# Patient Record
Sex: Female | Born: 1980 | Race: Black or African American | Hispanic: No | Marital: Single | Smoking: Never smoker
Health system: Southern US, Community
[De-identification: ages and names within clinical notes are randomized; demographics above are authoritative.]

---

## 2002-01-29 ENCOUNTER — Emergency Department (HOSPITAL_COMMUNITY): Admission: EM | Admit: 2002-01-29 | Discharge: 2002-01-29 | Payer: Self-pay | Admitting: Emergency Medicine

## 2003-03-27 ENCOUNTER — Observation Stay (HOSPITAL_COMMUNITY): Admission: AC | Admit: 2003-03-27 | Discharge: 2003-03-28 | Payer: Self-pay

## 2003-03-27 ENCOUNTER — Encounter: Payer: Self-pay | Admitting: Emergency Medicine

## 2003-04-06 ENCOUNTER — Ambulatory Visit (HOSPITAL_COMMUNITY): Admission: RE | Admit: 2003-04-06 | Discharge: 2003-04-06 | Payer: Self-pay | Admitting: Orthopedic Surgery

## 2003-04-06 ENCOUNTER — Encounter: Payer: Self-pay | Admitting: Orthopedic Surgery

## 2004-10-10 ENCOUNTER — Ambulatory Visit: Payer: Self-pay | Admitting: Sports Medicine

## 2004-11-21 ENCOUNTER — Ambulatory Visit: Payer: Self-pay | Admitting: Sports Medicine

## 2005-06-14 ENCOUNTER — Ambulatory Visit: Payer: Self-pay | Admitting: Family Medicine

## 2005-10-30 ENCOUNTER — Ambulatory Visit: Payer: Self-pay | Admitting: Family Medicine

## 2006-10-17 ENCOUNTER — Ambulatory Visit: Payer: Self-pay | Admitting: Family Medicine

## 2006-10-17 DIAGNOSIS — B9789 Other viral agents as the cause of diseases classified elsewhere: Secondary | ICD-10-CM

## 2007-01-22 ENCOUNTER — Encounter: Payer: Self-pay | Admitting: *Deleted

## 2007-02-20 ENCOUNTER — Telehealth: Payer: Self-pay | Admitting: *Deleted

## 2008-08-05 ENCOUNTER — Telehealth: Payer: Self-pay | Admitting: *Deleted

## 2008-09-03 ENCOUNTER — Telehealth (INDEPENDENT_AMBULATORY_CARE_PROVIDER_SITE_OTHER): Payer: Self-pay | Admitting: *Deleted

## 2008-09-09 ENCOUNTER — Ambulatory Visit: Payer: Self-pay | Admitting: Family Medicine

## 2008-09-09 ENCOUNTER — Telehealth: Payer: Self-pay | Admitting: Family Medicine

## 2008-09-09 DIAGNOSIS — J069 Acute upper respiratory infection, unspecified: Secondary | ICD-10-CM | POA: Insufficient documentation

## 2009-07-01 ENCOUNTER — Ambulatory Visit (HOSPITAL_COMMUNITY): Admission: RE | Admit: 2009-07-01 | Discharge: 2009-07-01 | Payer: Self-pay | Admitting: Obstetrics

## 2009-09-23 ENCOUNTER — Ambulatory Visit (HOSPITAL_COMMUNITY): Admission: RE | Admit: 2009-09-23 | Discharge: 2009-09-23 | Payer: Self-pay | Admitting: Obstetrics

## 2009-11-23 ENCOUNTER — Inpatient Hospital Stay (HOSPITAL_COMMUNITY): Admission: RE | Admit: 2009-11-23 | Discharge: 2009-11-27 | Payer: Self-pay | Admitting: Obstetrics

## 2010-08-19 ENCOUNTER — Ambulatory Visit (HOSPITAL_COMMUNITY)
Admission: RE | Admit: 2010-08-19 | Discharge: 2010-08-19 | Payer: Self-pay | Source: Home / Self Care | Attending: Obstetrics | Admitting: Obstetrics

## 2010-09-09 ENCOUNTER — Ambulatory Visit (HOSPITAL_COMMUNITY)
Admission: RE | Admit: 2010-09-09 | Discharge: 2010-09-09 | Payer: Self-pay | Source: Home / Self Care | Attending: Obstetrics | Admitting: Obstetrics

## 2010-09-09 ENCOUNTER — Other Ambulatory Visit (HOSPITAL_COMMUNITY): Payer: Self-pay | Admitting: Obstetrics

## 2010-09-09 DIAGNOSIS — R6252 Short stature (child): Secondary | ICD-10-CM

## 2010-10-07 ENCOUNTER — Ambulatory Visit (HOSPITAL_COMMUNITY)
Admission: RE | Admit: 2010-10-07 | Discharge: 2010-10-07 | Disposition: A | Discharge status: Home / Self Care | Payer: Private Health Insurance - Indemnity | Source: Ambulatory Visit | Attending: Obstetrics | Admitting: Obstetrics

## 2010-10-07 ENCOUNTER — Other Ambulatory Visit (HOSPITAL_COMMUNITY): Payer: Self-pay | Admitting: Obstetrics

## 2010-10-07 DIAGNOSIS — O26879 Cervical shortening, unspecified trimester: Secondary | ICD-10-CM | POA: Insufficient documentation

## 2010-10-07 DIAGNOSIS — O34219 Maternal care for unspecified type scar from previous cesarean delivery: Secondary | ICD-10-CM

## 2010-10-07 DIAGNOSIS — R6252 Short stature (child): Secondary | ICD-10-CM

## 2010-10-07 DIAGNOSIS — Z3689 Encounter for other specified antenatal screening: Secondary | ICD-10-CM | POA: Insufficient documentation

## 2010-10-21 ENCOUNTER — Ambulatory Visit (HOSPITAL_COMMUNITY): Payer: Private Health Insurance - Indemnity

## 2010-11-01 ENCOUNTER — Other Ambulatory Visit (HOSPITAL_COMMUNITY): Payer: Self-pay | Admitting: Obstetrics

## 2010-11-01 ENCOUNTER — Ambulatory Visit (HOSPITAL_COMMUNITY)
Admission: RE | Admit: 2010-11-01 | Discharge: 2010-11-01 | Disposition: A | Discharge status: Home / Self Care | Payer: Private Health Insurance - Indemnity | Source: Ambulatory Visit | Attending: Obstetrics | Admitting: Obstetrics

## 2010-11-01 DIAGNOSIS — O26879 Cervical shortening, unspecified trimester: Secondary | ICD-10-CM

## 2010-11-01 DIAGNOSIS — O34219 Maternal care for unspecified type scar from previous cesarean delivery: Secondary | ICD-10-CM | POA: Insufficient documentation

## 2010-11-02 LAB — CBC
HCT: 34.8 % — ABNORMAL LOW (ref 36.0–46.0)
Hemoglobin: 11.5 g/dL — ABNORMAL LOW (ref 12.0–15.0)
MCV: 86.8 fL (ref 78.0–100.0)
RBC: 3.93 MIL/uL (ref 3.87–5.11)
RBC: 4.38 MIL/uL (ref 3.87–5.11)
WBC: 10.8 10*3/uL — ABNORMAL HIGH (ref 4.0–10.5)
WBC: 7.1 10*3/uL (ref 4.0–10.5)

## 2010-11-02 LAB — RPR: RPR Ser Ql: NONREACTIVE

## 2010-11-04 ENCOUNTER — Other Ambulatory Visit (HOSPITAL_COMMUNITY): Payer: Private Health Insurance - Indemnity

## 2010-11-15 ENCOUNTER — Ambulatory Visit (HOSPITAL_COMMUNITY)
Admission: RE | Admit: 2010-11-15 | Discharge: 2010-11-15 | Disposition: A | Discharge status: Home / Self Care | Payer: Managed Care, Other (non HMO) | Source: Ambulatory Visit | Attending: Obstetrics | Admitting: Obstetrics

## 2010-11-15 DIAGNOSIS — O26879 Cervical shortening, unspecified trimester: Secondary | ICD-10-CM

## 2010-11-15 DIAGNOSIS — O34219 Maternal care for unspecified type scar from previous cesarean delivery: Secondary | ICD-10-CM

## 2011-01-10 ENCOUNTER — Other Ambulatory Visit (HOSPITAL_COMMUNITY): Payer: Self-pay | Admitting: Obstetrics

## 2011-01-10 DIAGNOSIS — O34219 Maternal care for unspecified type scar from previous cesarean delivery: Secondary | ICD-10-CM

## 2011-01-11 ENCOUNTER — Ambulatory Visit (HOSPITAL_COMMUNITY)
Admission: RE | Admit: 2011-01-11 | Discharge: 2011-01-11 | Disposition: A | Discharge status: Home / Self Care | Payer: Managed Care, Other (non HMO) | Source: Ambulatory Visit | Attending: Obstetrics | Admitting: Obstetrics

## 2011-01-11 ENCOUNTER — Other Ambulatory Visit (HOSPITAL_COMMUNITY): Payer: Self-pay | Admitting: Obstetrics

## 2011-01-11 DIAGNOSIS — Z3689 Encounter for other specified antenatal screening: Secondary | ICD-10-CM

## 2011-01-11 DIAGNOSIS — O3660X0 Maternal care for excessive fetal growth, unspecified trimester, not applicable or unspecified: Secondary | ICD-10-CM | POA: Insufficient documentation

## 2011-01-11 DIAGNOSIS — O34219 Maternal care for unspecified type scar from previous cesarean delivery: Secondary | ICD-10-CM

## 2011-01-13 ENCOUNTER — Inpatient Hospital Stay (HOSPITAL_COMMUNITY)
Admission: RE | Admit: 2011-01-13 | Discharge: 2011-01-16 | DRG: 766 | Disposition: A | Discharge status: Home / Self Care | Payer: Managed Care, Other (non HMO) | Source: Ambulatory Visit | Attending: Obstetrics | Admitting: Obstetrics

## 2011-01-13 ENCOUNTER — Other Ambulatory Visit: Payer: Self-pay | Admitting: Obstetrics

## 2011-01-13 DIAGNOSIS — Z302 Encounter for sterilization: Secondary | ICD-10-CM

## 2011-01-13 DIAGNOSIS — O34219 Maternal care for unspecified type scar from previous cesarean delivery: Principal | ICD-10-CM | POA: Diagnosis present

## 2011-01-13 LAB — CBC
HCT: 38 % (ref 36.0–46.0)
Hemoglobin: 12.4 g/dL (ref 12.0–15.0)
MCH: 26.7 pg (ref 26.0–34.0)
MCHC: 32.6 g/dL (ref 30.0–36.0)
MCV: 81.9 fL (ref 78.0–100.0)

## 2011-01-14 LAB — CBC
Hemoglobin: 10.3 g/dL — ABNORMAL LOW (ref 12.0–15.0)
MCH: 26.1 pg (ref 26.0–34.0)
MCV: 81.2 fL (ref 78.0–100.0)
RBC: 3.94 MIL/uL (ref 3.87–5.11)
WBC: 7.3 10*3/uL (ref 4.0–10.5)

## 2011-01-18 NOTE — Op Note (Signed)
  NAMESHAWAN, TOSH NO.:  [id number]  MEDICAL RECORD NO.:  [id number]           PATIENT TYPE:  I  LOCATION:  9108                          FACILITY:  [facility]  PHYSICIAN:  Kathreen Cosier, M.D.DATE OF BIRTH:  1981/04/30  DATE OF PROCEDURE:  01/13/2011 DATE OF DISCHARGE:                              OPERATIVE REPORT   PREOPERATIVE DIAGNOSIS:  Previous cesarean section at term, desires repeat and tubal ligation.  SURGEON:  Kathreen Cosier, MD  FIRST ASSISTANT:  Charles A. Clearance Coots, MD  ANESTHESIA:  Spinal.  PROCEDURE:  The patient was placed on the operating table in supine position.  After the spinal administered, abdomen was prepped and draped.  Bladder emptied with Foley catheter.  Transverse suprapubic incision was made, carried down to rectus fascia.  Fascia was cleaned and incised at length of the incision.  Recti muscles retracted laterally.  Peritoneum incised longitudinally.  Transverse incision was made in the visceral peritoneum above the bladder.  Bladder mobilized inferiorly.  Transverse lower uterine incision made.  The fluid was meconium stained.  The patient delivered of a female 8 pounds 6 ounces with a loose shoulder cord.  The team was in attendance.  Apgars were 8 and 9.  Placenta posterior, removed manually and sent to pathology. Uterine cavity cleaned with dry laps.  The uterine was incision closed in one layer with continuous suture of #1 chromic.  Bladder flap reattached with 2-0 chromic.  Right tube grasped in midportion with Babcock clamp.  Zero plain suture placed in the mesosalpinx below the portion of tube within clamp.  This was tied with zero plain suture and approximately 1 inch of tube transected.  The procedure done in a similar fashion the other side.  Lap and sponge counts correct.  Abdomen closed in layers.  Peritoneum, continuous suture of 0 chromic.  Fascia, continuous suture of 0 Dexon and the skin closed with  subcuticular stitch of 4-0 Monocryl.  Blood loss was 600 mL.  The patient tolerated the procedure well, taken to recovery room in good condition.          ______________________________ Kathreen Cosier, M.D.     BAM/MEDQ  D:  01/13/2011  T:  01/14/2011  Job:  [id number]  Electronically Signed by Francoise Ceo M.D. on 01/18/2011 07:47:06 AM

## 2011-01-18 NOTE — Discharge Summary (Signed)
  NAMETYISHIA, AUNE NO.:  [id number]  MEDICAL RECORD NO.:  [id number]           PATIENT TYPE:  I  LOCATION:  9108                          FACILITY:  [facility]  PHYSICIAN:  Kathreen Cosier, M.D.DATE OF BIRTH:  1981/05/27  DATE OF ADMISSION:  01/13/2011 DATE OF DISCHARGE:  01/16/2011                              DISCHARGE SUMMARY   The patient is a 30 year old gravida 4, para 1-0-2-1, [facility] January 14, 2011. She had a previous C-section for macrosomia and was now at term and wanted repeat C-section and tubal ligation.  Her GBS was negative and she had no problems with pregnancy.  She underwent a repeat low- transverse cesarean section and tubal ligation.  She had a female Apgar 8 and 9, weighing 8 pounds 6 ounces.  She had she had meconium-stained fluid.  Placenta was sent to Pathology.  Postoperatively, she did well. Her hemoglobin was 10.3.  She was discharged on the third postoperative day, ambulatory on a regular diet to see me in 6 weeks.  DISCHARGE DIAGNOSIS:  Status post repeat low-transverse cesarean section at term and tubal ligation for multiparity.          ______________________________ Kathreen Cosier, M.D.     BAM/MEDQ  D:  01/16/2011  T:  01/16/2011  Job:  [id number]  Electronically Signed by Francoise Ceo M.D. on 01/18/2011 07:47:08 AM

## 2012-03-17 IMAGING — US US OB FOLLOW-UP
1 series · 13 of 13 positions shown · non-contrast
Comparison: none

[Series 1: us ob follow up · 13 acquisitions, 13 frames shown]
[im 1/13]
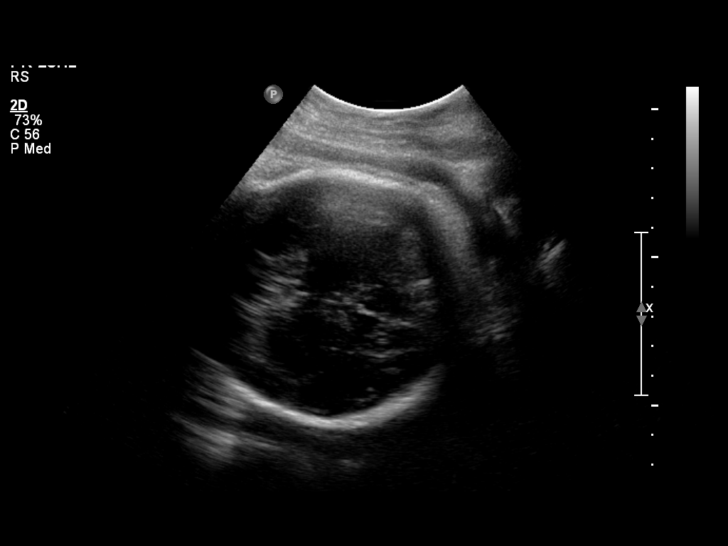
[im 2/13]
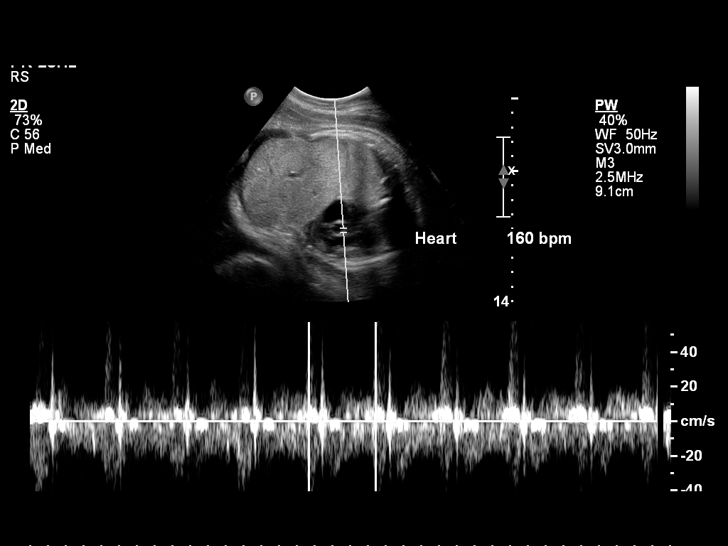
[im 3/13]
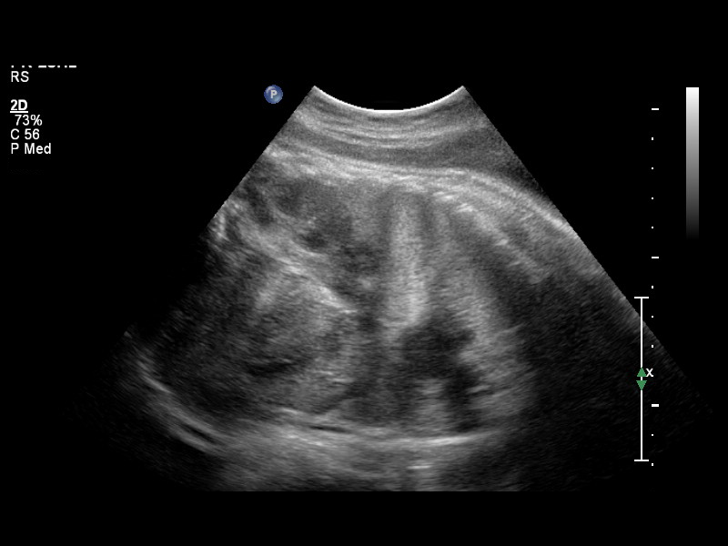
[im 4/13]
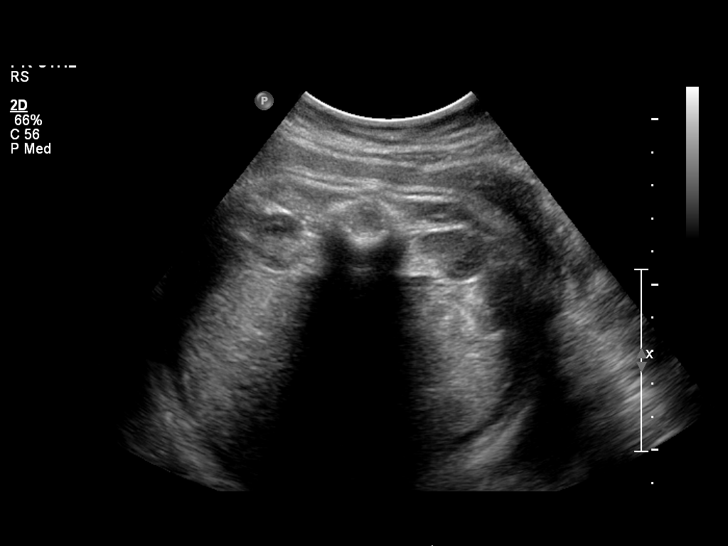
[im 5/13]
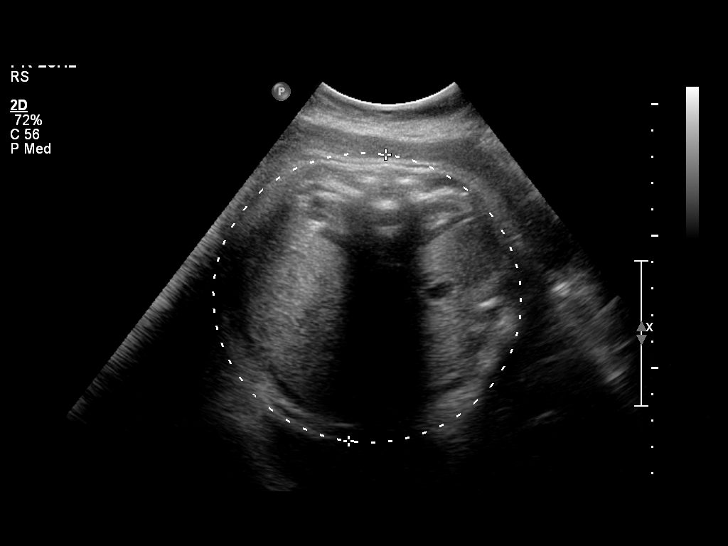
[im 6/13]
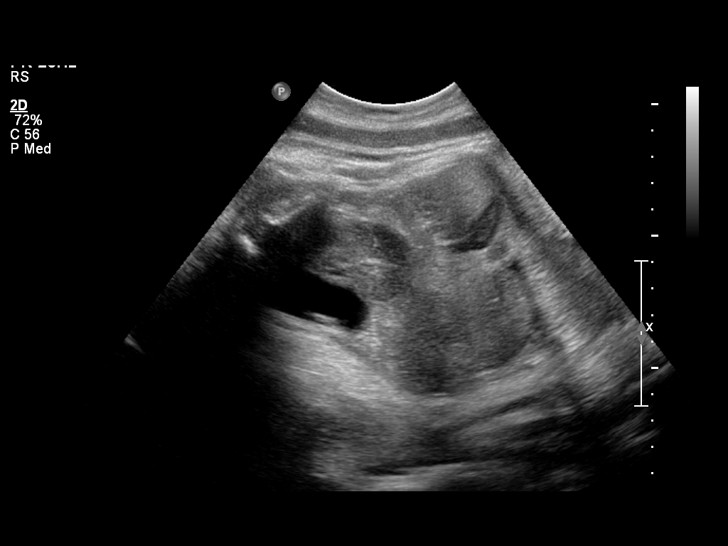
[im 7/13]
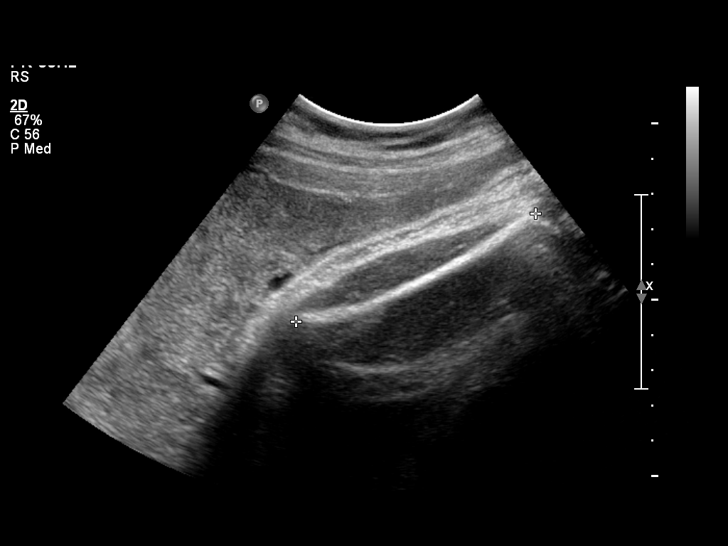
[im 8/13]
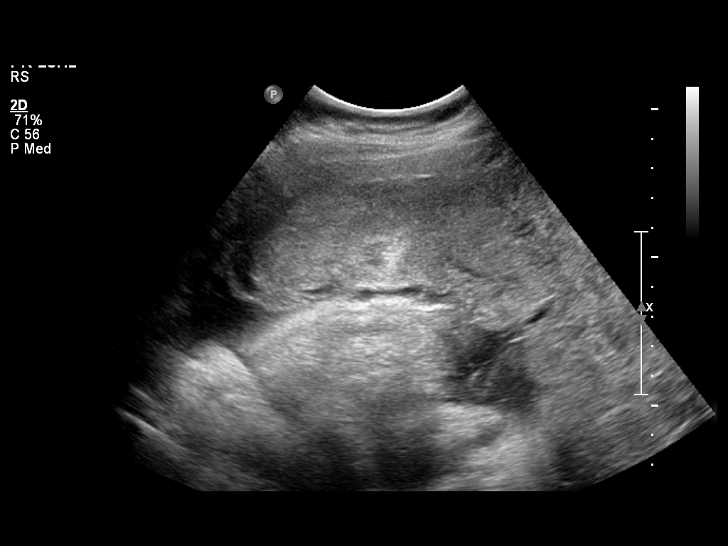
[im 9/13]
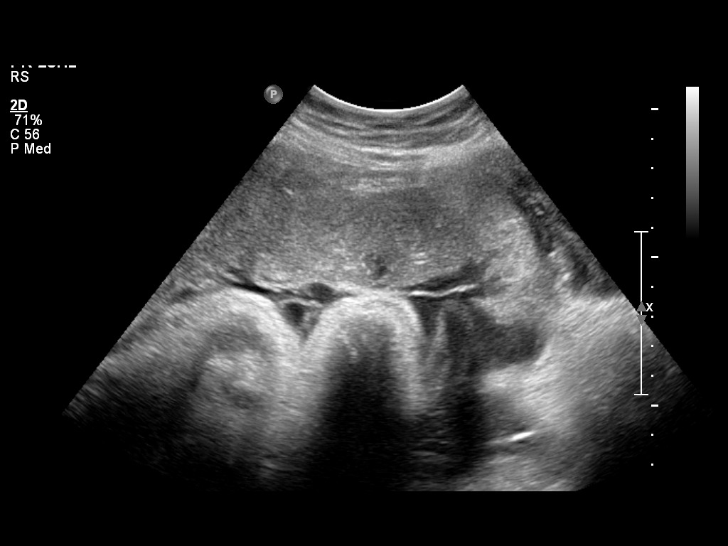
[im 10/13]
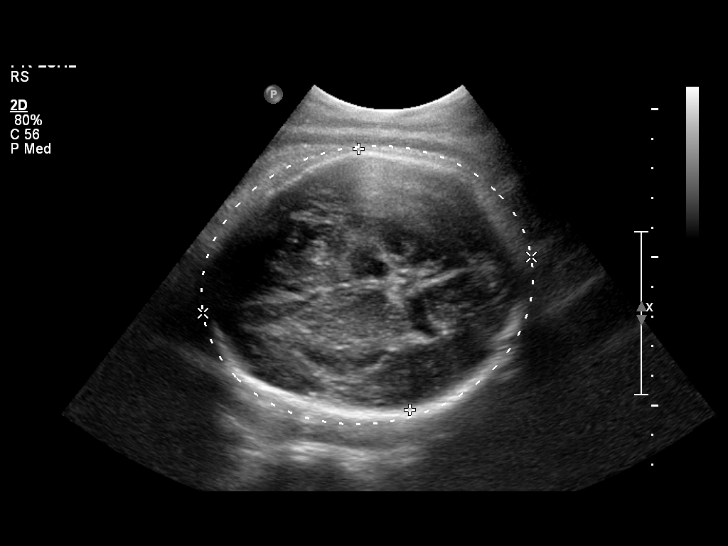
[im 11/13]
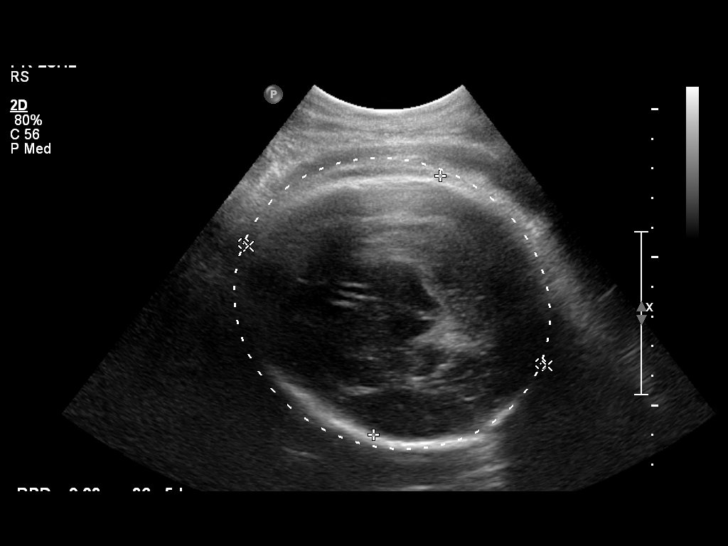
[im 12/13]
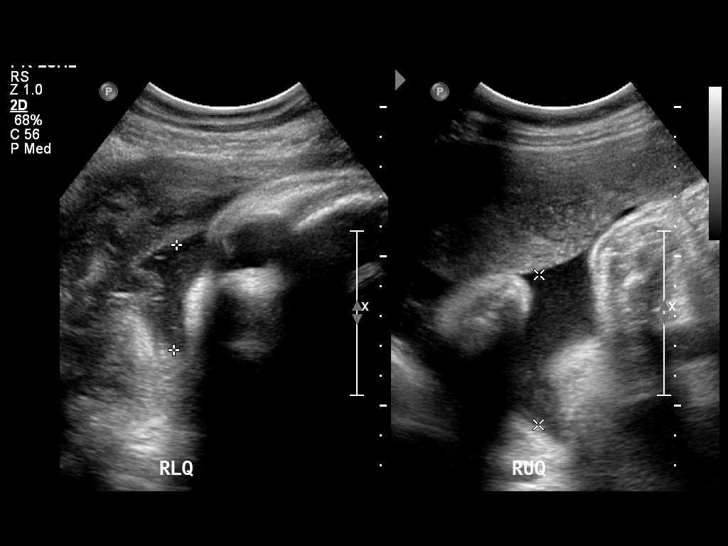
[im 13/13]
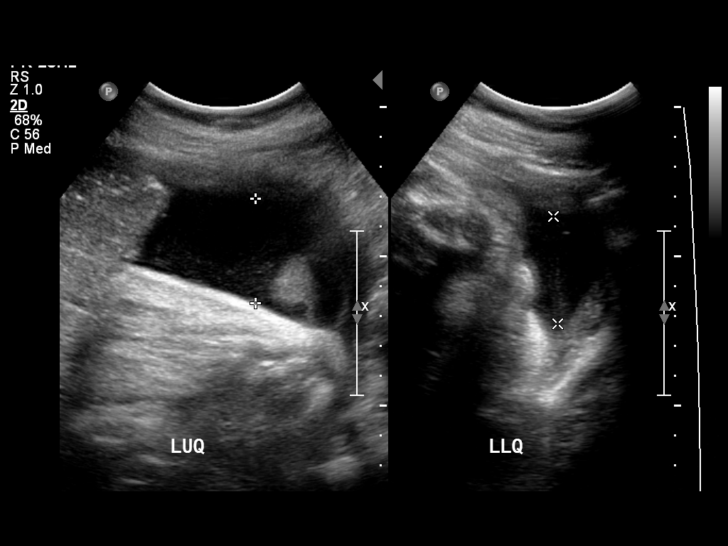

[13 of 13 positions shown; findings below may reference images not displayed]

OBSTETRICS REPORT
                      (Signed Final 01/11/2011 [DATE])

 Order#:         [id number]
Procedures

 US OB FOLLOW UP                                       76816.1
Indications

 Assess Fetal Growth / Estimated Fetal Weight
 Size greater than dates (Large for gestational [AGE]
Fetal Evaluation

 Fetal Heart Rate:  160                          bpm
 Cardiac Activity:  Observed
 Presentation:      Cephalic
 Placenta:          Anterior, above cervical os

 Amniotic Fluid
 AFI FV:      Subjectively within normal limits
 AFI Sum:     15.64   cm       65  %Tile     Larg Pckt:    5.02  cm
 RUQ:   5.02    cm   RLQ:    3.5    cm    LUQ:   3.6     cm   LLQ:    3.52   cm
Biometry

 BPD:     90.1  mm     G. Age:  36w 4d                CI:         82.9   70 - 86
 OFD:    108.7  mm                                    FL/HC:      23.0   20.7 -

 HC:     323.9  mm     G. Age:  36w 5d      < 3  %    HC/AC:      0.91   0.87 -

 AC:     356.2  mm     G. Age:  39w 4d       73  %    FL/BPD:     82.7   71 - 87
 FL:      74.5  mm     G. Age:  38w 1d       24  %    FL/AC:      20.9   20 - 24

 Est. FW:    7615  gm    7 lb 12 oz      72  %
Gestational Age

 LMP:           39w 4d        Date:  04/09/10                 EDD:   01/14/11
 U/S Today:     37w 5d                                        EDD:   01/27/11
 Best:          39w 4d     Det. By:  LMP  (04/09/10)          EDD:   01/14/11
Anatomy

 Cranium:           Appears normal      Aortic Arch:       Previously seen
 Fetal Cavum:       Previously seen     Ductal Arch:       Previously seen
 Ventricles:        Previously seen     Diaphragm:         Previously seen
 Choroid Plexus:    Previously seen     Stomach:           Appears
                                                           normal, left
                                                           sided
 Cerebellum:        Previously seen     Abdomen:           Previously seen
 Posterior Fossa:   Previously seen     Abdominal Wall:    Previously seen
 Nuchal Fold:       Previously seen     Cord Vessels:      Previously seen
 Face:              Previously seen     Kidneys:           Appear normal
 Heart:             Previously seen     Bladder:           Appears normal
 RVOT:              Previously seen     Spine:             Previously seen
 LVOT:              Previously seen     Limbs:             Previously seen
Cervix Uterus Adnexa

 Cervix:       Not visualized (advanced GA >34 wks)
Impression

 Assigned GA is currently 39w 4d.   Appropriate fetal growth,
 with EFW at 72 %ile.
 Amniotic fluid within normal limits, with AFI of 15.64 cm.

## 2013-06-19 ENCOUNTER — Other Ambulatory Visit: Payer: Self-pay

## 2015-08-11 ENCOUNTER — Emergency Department (HOSPITAL_BASED_OUTPATIENT_CLINIC_OR_DEPARTMENT_OTHER): Payer: BLUE CROSS/BLUE SHIELD

## 2015-08-11 ENCOUNTER — Encounter (HOSPITAL_BASED_OUTPATIENT_CLINIC_OR_DEPARTMENT_OTHER): Payer: Self-pay | Admitting: Emergency Medicine

## 2015-08-11 ENCOUNTER — Emergency Department (HOSPITAL_BASED_OUTPATIENT_CLINIC_OR_DEPARTMENT_OTHER)
Admission: EM | Admit: 2015-08-11 | Discharge: 2015-08-12 | Disposition: A | Discharge status: Home / Self Care | Payer: BLUE CROSS/BLUE SHIELD | Attending: Emergency Medicine | Admitting: Emergency Medicine

## 2015-08-11 DIAGNOSIS — J069 Acute upper respiratory infection, unspecified: Secondary | ICD-10-CM | POA: Diagnosis not present

## 2015-08-11 DIAGNOSIS — R1110 Vomiting, unspecified: Secondary | ICD-10-CM | POA: Insufficient documentation

## 2015-08-11 DIAGNOSIS — Z3202 Encounter for pregnancy test, result negative: Secondary | ICD-10-CM | POA: Diagnosis not present

## 2015-08-11 DIAGNOSIS — R05 Cough: Secondary | ICD-10-CM | POA: Diagnosis present

## 2015-08-11 LAB — RAPID STREP SCREEN (MED CTR MEBANE ONLY): STREPTOCOCCUS, GROUP A SCREEN (DIRECT): NEGATIVE

## 2015-08-11 LAB — PREGNANCY, URINE: PREG TEST UR: NEGATIVE

## 2015-08-11 NOTE — ED Notes (Signed)
Patient transported to X-ray 

## 2015-08-11 NOTE — ED Notes (Addendum)
Patient reports that she went to her PMD and was seen and treated with Rx medications. The patient reports that the medications that she was prescribed were not called into the pharmacy. So she came here. Patient reports that she has had an on and off sore throat with cough. Patient is intermittently smiling when she is answering the questions.

## 2015-08-11 NOTE — ED Provider Notes (Signed)
CSN: [id number]     Arrival date & time 08/11/15  2058 History   First MD Initiated Contact with Patient 08/11/15 2256     Chief Complaint  Patient presents with  . Cough     (Consider location/radiation/quality/duration/timing/severity/associated sxs/prior Treatment) HPI Comments: Cough, severe 4 weeks of cough however worse, more dry, painful over last 3-4 days Congestion, clear Mild SOB with cough Sore throat, today from coughing Vomiting x1 yesterday Today nausea No urinary symptoms, abd pain Theraflu, mucinex not helping HA started yesterday, started slowly got worse, with cough Medicine helps temporarily Saw regular doctor, was supposed to give abx Wheezing  No med problems    Patient is a 34 y.o. female presenting with cough.  Cough Associated symptoms: chest pain (with cough), fever, headaches, rhinorrhea, sore throat and wheezing   Associated symptoms: no rash and no shortness of breath     History reviewed. No pertinent past medical history. Past Surgical History  Procedure Laterality Date  . Cesarean section     No family history on file. Social History  Substance Use Topics  . Smoking status: Never Smoker   . Smokeless tobacco: None  . Alcohol Use: Yes     Comment: drinks almost every day but it varies between all of the types that she drinks.    OB History    No data available     Review of Systems  Constitutional: Positive for fever and fatigue.  HENT: Positive for congestion, rhinorrhea and sore throat.   Eyes: Negative for visual disturbance.  Respiratory: Positive for cough and wheezing. Negative for shortness of breath.   Cardiovascular: Positive for chest pain (with cough).  Gastrointestinal: Positive for vomiting (yesterday). Negative for nausea and abdominal pain.  Genitourinary: Negative for dysuria and difficulty urinating.  Musculoskeletal: Negative for back pain and neck pain.  Skin: Negative for rash.  Neurological: Positive for  headaches. Negative for syncope.      Allergies  Review of patient's allergies indicates no known allergies.  Home Medications   Prior to Admission medications   Medication Sig Start Date End Date Taking? Authorizing Provider  benzonatate (TESSALON) 100 MG capsule Take 1 capsule (100 mg total) by mouth every 8 (eight) hours. 08/12/15   Alvira Monday, MD   BP 117/79 mmHg  Pulse 92  Temp(Src) 100.4 F (38 C) (Oral)  Resp 18  Ht  (1.626 m)  Wt 183 lb (83.008 kg)  BMI 31.40 kg/m2  SpO2 99%  LMP 08/04/2015 Physical Exam  Constitutional: She is oriented to person, place, and time. She appears well-developed and well-nourished. No distress.  HENT:  Head: Normocephalic and atraumatic.  Mouth/Throat: Oropharynx is clear and moist. No oropharyngeal exudate.  Eyes: Conjunctivae and EOM are normal.  Neck: Normal range of motion.  Cardiovascular: Normal rate, regular rhythm, normal heart sounds and intact distal pulses.  Exam reveals no gallop and no friction rub.   No murmur heard. Pulmonary/Chest: Effort normal and breath sounds normal. No respiratory distress. She has no wheezes. She has no rales.  Abdominal: Soft. She exhibits no distension. There is no tenderness. There is no guarding.  Musculoskeletal: She exhibits no edema or tenderness.  Neurological: She is alert and oriented to person, place, and time.  Skin: Skin is warm and dry. No rash noted. She is not diaphoretic. No erythema.  Nursing note and vitals reviewed.   ED Course  Procedures (including critical care time) Labs Review Labs Reviewed  RAPID STREP SCREEN (NOT AT [facility])  CULTURE, GROUP A STREP  PREGNANCY, URINE    Imaging Review Dg Chest 2 View  08/11/2015  CLINICAL DATA:  Acute onset of generalized chest pain, cough, shortness of breath, fever and sore throat. Initial encounter. EXAM: CHEST  2 VIEW COMPARISON:  None. FINDINGS: The lungs are well-aerated. Mild peribronchial thickening is noted. There  is no evidence of focal opacification, pleural effusion or pneumothorax. The heart is normal in size; the mediastinal contour is within normal limits. No acute osseous abnormalities are seen. IMPRESSION: Mild peribronchial thickening noted.  Lungs otherwise clear. Electronically Signed   By: Roanna RaiderJeffery  Chang M.D.   On: 08/11/2015 23:51   I have personally reviewed and evaluated these images and lab results as part of my medical decision-making.   EKG Interpretation None      MDM   Final diagnoses:  URI (upper respiratory infection)   34yo female with no significant medical history presents with concern for cough, sore throat, nasal congestion.  CXR shows no sign of pneumonia.  Strep screen negative. No sign PTA/RPA/epiglottitis/otitis media. No facial tenderness and only days of nasa lcongestion and doubt bacterial sinus infection.  Patient with temperature of 100.4. No urinary symptoms and benign abdominal exam and doubt other intraabdominal cause of fevers. Pt likely with viral syndrome as etiology of symptoms.  Recommend supportive treatment, wrote rx for tessalon pearls for cough. Patient discharged in stable condition with understanding of reasons to return.   Alvira MondayErin Roczen Waymire, MD 08/12/15 [phone]

## 2015-08-12 MED ORDER — BENZONATATE 100 MG PO CAPS: 100.0000 mg | ORAL_CAPSULE | Freq: Three times a day (TID) | ORAL | Status: DC

## 2015-08-12 NOTE — Discharge Instructions (Signed)
Upper Respiratory Infection, Adult Most upper respiratory infections (URIs) are a viral infection of the air passages leading to the lungs. A URI affects the nose, throat, and upper air passages. The most common type of URI is nasopharyngitis and is typically referred to as "the common cold." URIs run their course and usually go away on their own. Most of the time, a URI does not require medical attention, but sometimes a bacterial infection in the upper airways can follow a viral infection. This is called a secondary infection. Sinus and middle ear infections are common types of secondary upper respiratory infections. Bacterial pneumonia can also complicate a URI. A URI can worsen asthma and chronic obstructive pulmonary disease (COPD). Sometimes, these complications can require emergency medical care and may be life threatening.  CAUSES Almost all URIs are caused by viruses. A virus is a type of germ and can spread from one person to another.  RISKS FACTORS You may be at risk for a URI if:   You smoke.   You have chronic heart or lung disease.  You have a weakened defense (immune) system.   You are very young or very old.   You have nasal allergies or asthma.  You work in crowded or poorly ventilated areas.  You work in health care facilities or schools. SIGNS AND SYMPTOMS  Symptoms typically develop 2-3 days after you come in contact with a cold virus. Most viral URIs last 7-10 days. However, viral URIs from the influenza virus (flu virus) can last 14-18 days and are typically more severe. Symptoms may include:   Runny or stuffy (congested) nose.   Sneezing.   Cough.   Sore throat.   Headache.   Fatigue.   Fever.   Loss of appetite.   Pain in your forehead, behind your eyes, and over your cheekbones (sinus pain).  Muscle aches.  DIAGNOSIS  Your health care provider may diagnose a URI by:  Physical exam.  Tests to check that your symptoms are not due to  another condition such as:  Strep throat.  Sinusitis.  Pneumonia.  Asthma. TREATMENT  A URI goes away on its own with time. It cannot be cured with medicines, but medicines may be prescribed or recommended to relieve symptoms. Medicines may help:  Reduce your fever.  Reduce your cough.  Relieve nasal congestion. HOME CARE INSTRUCTIONS   Take medicines only as directed by your health care provider.   Gargle warm saltwater or take cough drops to comfort your throat as directed by your health care provider.  Use a warm mist humidifier or inhale steam from a shower to increase air moisture. This may make it easier to breathe.  Drink enough fluid to keep your urine clear or pale yellow.   Eat soups and other clear broths and maintain good nutrition.   Rest as needed.   Return to work when your temperature has returned to normal or as your health care provider advises. You may need to stay home longer to avoid infecting others. You can also use a face mask and careful hand washing to prevent spread of the virus.  Increase the usage of your inhaler if you have asthma.   Do not use any tobacco products, including cigarettes, chewing tobacco, or electronic cigarettes. If you need help quitting, ask your health care provider. PREVENTION  The best way to protect yourself from getting a cold is to practice good hygiene.   Avoid oral or hand contact with people with cold   symptoms.   Wash your hands often if contact occurs.  There is no clear evidence that vitamin C, vitamin E, echinacea, or exercise reduces the chance of developing a cold. However, it is always recommended to get plenty of rest, exercise, and practice good nutrition.  SEEK MEDICAL CARE IF:   You are getting worse rather than better.   Your symptoms are not controlled by medicine.   You have chills.  You have worsening shortness of breath.  You have brown or red mucus.  You have yellow or brown nasal  discharge.  You have pain in your face, especially when you bend forward.  You have a fever.  You have swollen neck glands.  You have pain while swallowing.  You have white areas in the back of your throat. SEEK IMMEDIATE MEDICAL CARE IF:   You have severe or persistent:  Headache.  Ear pain.  Sinus pain.  Chest pain.  You have chronic lung disease and any of the following:  Wheezing.  Prolonged cough.  Coughing up blood.  A change in your usual mucus.  You have a stiff neck.  You have changes in your:  Vision.  Hearing.  Thinking.  Mood. MAKE SURE YOU:   Understand these instructions.  Will watch your condition.  Will get help right away if you are not doing well or get worse.   This information is not intended to replace advice given to you by your health care provider. Make sure you discuss any questions you have with your health care provider.   Document Released: 01/24/2001 Document Revised: 12/15/2014 Document Reviewed: 11/05/2013 Elsevier Interactive Patient Education 2016 [employer]  

## 2015-08-14 LAB — CULTURE, GROUP A STREP: Strep A Culture: NEGATIVE

## 2016-05-02 LAB — LAB REPORT - SCANNED: PAP SMEAR: NEGATIVE

## 2018-04-29 ENCOUNTER — Emergency Department (HOSPITAL_COMMUNITY)
Admission: EM | Admit: 2018-04-29 | Discharge: 2018-04-29 | Disposition: A | Discharge status: Home / Self Care | Payer: Medicaid Other | Attending: Emergency Medicine | Admitting: Emergency Medicine

## 2018-04-29 ENCOUNTER — Encounter (HOSPITAL_COMMUNITY): Payer: Self-pay | Admitting: Emergency Medicine

## 2018-04-29 ENCOUNTER — Emergency Department (HOSPITAL_COMMUNITY): Payer: Medicaid Other

## 2018-04-29 DIAGNOSIS — Y929 Unspecified place or not applicable: Secondary | ICD-10-CM | POA: Diagnosis not present

## 2018-04-29 DIAGNOSIS — S0083XA Contusion of other part of head, initial encounter: Secondary | ICD-10-CM | POA: Diagnosis not present

## 2018-04-29 DIAGNOSIS — Y9389 Activity, other specified: Secondary | ICD-10-CM | POA: Diagnosis not present

## 2018-04-29 DIAGNOSIS — S41112A Laceration without foreign body of left upper arm, initial encounter: Secondary | ICD-10-CM | POA: Diagnosis not present

## 2018-04-29 DIAGNOSIS — S56522A Laceration of other extensor muscle, fascia and tendon at forearm level, left arm, initial encounter: Secondary | ICD-10-CM

## 2018-04-29 DIAGNOSIS — D649 Anemia, unspecified: Secondary | ICD-10-CM | POA: Diagnosis not present

## 2018-04-29 DIAGNOSIS — S0990XA Unspecified injury of head, initial encounter: Secondary | ICD-10-CM | POA: Diagnosis present

## 2018-04-29 DIAGNOSIS — S56922A Laceration of unspecified muscles, fascia and tendons at forearm level, left arm, initial encounter: Secondary | ICD-10-CM

## 2018-04-29 DIAGNOSIS — S51802A Unspecified open wound of left forearm, initial encounter: Secondary | ICD-10-CM

## 2018-04-29 DIAGNOSIS — Y999 Unspecified external cause status: Secondary | ICD-10-CM | POA: Diagnosis not present

## 2018-04-29 LAB — CBC WITH DIFFERENTIAL/PLATELET
Abs Immature Granulocytes: 0 10*3/uL (ref 0.0–0.1)
BASOS ABS: 0.1 10*3/uL (ref 0.0–0.1)
Basophils Relative: 1 %
EOS PCT: 5 %
Eosinophils Absolute: 0.2 10*3/uL (ref 0.0–0.7)
HCT: 30.2 % — ABNORMAL LOW (ref 36.0–46.0)
Hemoglobin: 8.8 g/dL — ABNORMAL LOW (ref 12.0–15.0)
Immature Granulocytes: 0 %
LYMPHS PCT: 25 %
Lymphs Abs: 1.1 10*3/uL (ref 0.7–4.0)
MCH: 20.8 pg — ABNORMAL LOW (ref 26.0–34.0)
MCHC: 29.1 g/dL — AB (ref 30.0–36.0)
MCV: 71.2 fL — ABNORMAL LOW (ref 78.0–100.0)
Monocytes Absolute: 0.4 10*3/uL (ref 0.1–1.0)
Monocytes Relative: 9 %
Neutro Abs: 2.7 10*3/uL (ref 1.7–7.7)
Neutrophils Relative %: 60 %
PLATELETS: 253 10*3/uL (ref 150–400)
RBC: 4.24 MIL/uL (ref 3.87–5.11)
RDW: 18.6 % — AB (ref 11.5–15.5)
WBC: 4.5 10*3/uL (ref 4.0–10.5)

## 2018-04-29 LAB — BASIC METABOLIC PANEL
Anion gap: 11 (ref 5–15)
BUN: 14 mg/dL (ref 6–20)
CO2: 23 mmol/L (ref 22–32)
Calcium: 8.9 mg/dL (ref 8.9–10.3)
Chloride: 104 mmol/L (ref 98–111)
Creatinine, Ser: 0.85 mg/dL (ref 0.44–1.00)
Glucose, Bld: 96 mg/dL (ref 70–99)
POTASSIUM: 3.3 mmol/L — AB (ref 3.5–5.1)
Sodium: 138 mmol/L (ref 135–145)

## 2018-04-29 LAB — I-STAT BETA HCG BLOOD, ED (MC, WL, AP ONLY): I-stat hCG, quantitative: <5 m[IU]/mL (ref <5)

## 2018-04-29 MED ORDER — TETANUS-DIPHTH-ACELL PERTUSSIS 5-2.5-18.5 LF-MCG/0.5 IM SUSP
0.5000 mL | Freq: Once | INTRAMUSCULAR | Status: DC
  Filled 2018-04-29: qty 0.5

## 2018-04-29 MED ORDER — LIDOCAINE-EPINEPHRINE (PF) 2 %-1:200000 IJ SOLN
10.0000 mL | Freq: Once | INTRAMUSCULAR | Status: AC
  Administered 2018-04-29: 10 mL
  Filled 2018-04-29: qty 20

## 2018-04-29 MED ORDER — TETANUS-DIPHTH-ACELL PERTUSSIS 5-2.5-18.5 LF-MCG/0.5 IM SUSP
0.5000 mL | Freq: Once | INTRAMUSCULAR | Status: AC
  Administered 2018-04-29: 0.5 mL via INTRAMUSCULAR

## 2018-04-29 MED ORDER — ACETAMINOPHEN 500 MG PO TABS
1000.0000 mg | ORAL_TABLET | Freq: Once | ORAL | Status: AC
  Administered 2018-04-29: 1000 mg via ORAL
  Filled 2018-04-29: qty 2

## 2018-04-29 MED ORDER — OXYCODONE-ACETAMINOPHEN 5-325 MG PO TABS
2.0000 | ORAL_TABLET | Freq: Once | ORAL | Status: AC
  Administered 2018-04-29: 2 via ORAL
  Filled 2018-04-29: qty 2

## 2018-04-29 MED ORDER — BACITRACIN ZINC 500 UNIT/GM EX OINT
TOPICAL_OINTMENT | Freq: Once | CUTANEOUS | Status: AC
  Administered 2018-04-29: 18:00:00 via TOPICAL
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
## 2018-04-29 NOTE — Discharge Instructions (Addendum)
Please have wound rechecked if you noticed pain or swelling Please use cold therapy to face and forearm Please use Tylenol and ibuprofen for pain Please return here if you are worse at any time. Your hemoglobin is low at 8.8.  Start iron and follow-up with your primary care physician for recheck in the next week
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
## 2018-05-12 ENCOUNTER — Encounter (HOSPITAL_COMMUNITY): Payer: Self-pay

## 2018-05-12 ENCOUNTER — Emergency Department (HOSPITAL_COMMUNITY)
Admission: EM | Admit: 2018-05-12 | Discharge: 2018-05-12 | Disposition: A | Discharge status: Home / Self Care | Payer: Medicaid Other | Attending: Emergency Medicine | Admitting: Emergency Medicine

## 2018-05-12 DIAGNOSIS — X58XXXD Exposure to other specified factors, subsequent encounter: Secondary | ICD-10-CM | POA: Insufficient documentation

## 2018-05-12 DIAGNOSIS — S51812D Laceration without foreign body of left forearm, subsequent encounter: Secondary | ICD-10-CM | POA: Insufficient documentation

## 2018-05-12 DIAGNOSIS — Z4802 Encounter for removal of sutures: Secondary | ICD-10-CM | POA: Insufficient documentation

## 2018-05-12 NOTE — ED Provider Notes (Signed)
[facility] EMERGENCY DEPARTMENT Provider Note   CSN: [id number] Arrival date & time: 05/12/18  1246     History   Chief Complaint Chief Complaint  Patient presents with  . Suture / Staple Removal    HPI Kayla [name] is a 37 y.o. female who presents for suture removal to left forearm laceration.  Patient has had good healing without any drainage or redness.  She has had minimal pain.  She has been washing it at home and using antibiotic ointment.  She denies any fevers.  HPI  History reviewed. No pertinent past medical history.  Patient Active Problem List   Diagnosis Date Noted  . URI 09/09/2008  . VIRAL INFECTION 10/17/2006    Past Surgical History:  Procedure Laterality Date  . CESAREAN SECTION       OB History   None      Home Medications    Prior to Admission medications   Medication Sig Start Date End Date Taking? Authorizing Provider  Aspirin-Salicylamide-Caffeine (BC HEADACHE POWDER PO) Take 1 packet by mouth as needed (headache).    [provider]  benzonatate (TESSALON) 100 MG capsule Take 1 capsule (100 mg total) by mouth every 8 (eight) hours. Patient not taking: Reported on 04/29/2018 08/12/15   Alvira Monday, MD    Family History No family history on file.  Social History Social History   Tobacco Use  . Smoking status: Never Smoker  . Smokeless tobacco: Never Used  Substance Use Topics  . Alcohol use: Yes    Comment: drinks almost every day but it varies between all of the types that she drinks.   . Drug use: No     Allergies   Patient has no known allergies.   Review of Systems Review of Systems  Constitutional: Negative for fever.  Skin: Positive for wound.     Physical Exam Updated Vital Signs BP 126/88 (BP Location: Right Arm)   Pulse 89   Temp 99.3 F (37.4 C) (Oral)   Resp 16   LMP 04/14/2018   SpO2 100%   Physical Exam  Constitutional: She is oriented to person, place,  and time. She appears well-developed and well-nourished. No distress.  HENT:  Head: Normocephalic and atraumatic.  Eyes: Conjunctivae are normal. Right eye exhibits no discharge. Left eye exhibits no discharge. No scleral icterus.  Pulmonary/Chest: Effort normal. No respiratory distress.  Neurological: She is alert and oriented to person, place, and time. Coordination normal.  Skin: Skin is warm and dry. No rash noted. She is not diaphoretic. No pallor.  Well-healing laceration to the left forearm with sutures in place, no tenderness, no drainage or erythema  Psychiatric: She has a normal mood and affect. Her behavior is normal. Judgment and thought content normal.  Nursing note and vitals reviewed.    ED Treatments / Results  Labs (all labs ordered are listed, but only abnormal results are displayed) Labs Reviewed - No data to display  EKG None  Radiology No results found.  Procedures .Suture Removal Date/Time: 05/12/2018 1:46 PM Performed by: Emi Holes, PA-C Authorized by: Emi Holes, PA-C   Consent:    Consent obtained:  Verbal   Consent given by:  Patient   Risks discussed:  Bleeding and pain   Alternatives discussed:  No treatment Location:    Location:  Upper extremity   Upper extremity location:  Arm   Arm location:  L lower arm Procedure details:    Wound  appearance:  No signs of infection and clean   Number of sutures removed:  7 Post-procedure details:    Post-removal:  Antibiotic ointment applied and dressing applied   Patient tolerance of procedure:  Tolerated well, no immediate complications   (including critical care time)  Medications Ordered in ED Medications - No data to display   Initial Impression / Assessment and Plan / ED Course  I have reviewed the triage vital signs and the nursing notes.  Pertinent labs & imaging results that were available during my care of the patient were reviewed by me and considered in my medical decision  making (see chart for details).       Pt to ER for suture removal and wound check as above. Procedure tolerated well. Vitals normal, no signs of infection. Scar minimization & return precautions given at dc.  Patient understands and agrees with plan.  Patient vitals stable and discharged in satisfactory condition.   Final Clinical Impressions(s) / ED Diagnoses   Final diagnoses:  Visit for suture removal    ED Discharge Orders    None       Emi Holes, PA-C 05/12/18 1346    Loren Racer, MD 05/12/18 [phone]

## 2018-05-12 NOTE — Discharge Instructions (Signed)
Make sure to cover and/or use sunscreen while in the sun.  Continue washing with warm soapy water.  Apply antibiotic ointment once daily.  Please return to emergency department if you develop any increasing pain, redness, swelling, drainage, red streaking away from the area.

## 2018-05-12 NOTE — ED Triage Notes (Signed)
Pt presents for evaluation of suture removal to L forearm. Stitches to a laceration on 9/16.

## 2019-02-28 ENCOUNTER — Encounter (HOSPITAL_COMMUNITY): Payer: Self-pay

## 2019-02-28 ENCOUNTER — Other Ambulatory Visit: Payer: Self-pay

## 2019-02-28 ENCOUNTER — Ambulatory Visit (HOSPITAL_COMMUNITY)
Admission: EM | Admit: 2019-02-28 | Discharge: 2019-02-28 | Disposition: A | Discharge status: Home / Self Care | Payer: Medicaid Other | Attending: Internal Medicine | Admitting: Internal Medicine

## 2019-02-28 DIAGNOSIS — Z20822 Contact with and (suspected) exposure to covid-19: Secondary | ICD-10-CM

## 2019-02-28 DIAGNOSIS — Z20828 Contact with and (suspected) exposure to other viral communicable diseases: Secondary | ICD-10-CM | POA: Diagnosis present

## 2019-02-28 NOTE — ED Provider Notes (Signed)
  MRN: [id number] DOB: 02/07/1981  Subjective:   Kayla [name] is a 38 y.o. female presenting for COVID-19 testing.  Patient states last week and she spent time with her family member at a state.  They develop symptoms and got tested for COVID-19 earlier this week, turned out to be positive.  Patient denies having any symptoms.  She denies chronic conditions and has not needed to take any medications.  Has been isolating in practicing social distancing.  No current facility-administered medications for this encounter.   Current Outpatient Medications:  .  Aspirin-Salicylamide-Caffeine (BC HEADACHE POWDER PO), Take 1 packet by mouth as needed (headache)., Disp: , Rfl:    No Known Allergies  History reviewed. No pertinent past medical history.   Past Surgical History:  Procedure Laterality Date  . CESAREAN SECTION      ROS  Objective:   Vitals: BP 124/86 (BP Location: Left Arm)   Pulse 81   Temp 98.9 F (37.2 C) (Oral)   Resp 17   SpO2 100%   Physical Exam Constitutional:      General: She is not in acute distress.    Appearance: Normal appearance. She is well-developed. She is not ill-appearing, toxic-appearing or diaphoretic.  HENT:     Head: Normocephalic and atraumatic.     Nose: Nose normal.     Mouth/Throat:     Mouth: Mucous membranes are moist.  Eyes:     Extraocular Movements: Extraocular movements intact.     Pupils: Pupils are equal, round, and reactive to light.  Cardiovascular:     Rate and Rhythm: Normal rate and regular rhythm.     Pulses: Normal pulses.     Heart sounds: Normal heart sounds. No murmur. No friction rub. No gallop.   Pulmonary:     Effort: Pulmonary effort is normal. No respiratory distress.     Breath sounds: Normal breath sounds. No stridor. No wheezing, rhonchi or rales.  Skin:    General: Skin is warm and dry.     Findings: No rash.  Neurological:     Mental Status: She is alert and oriented to person, place, and time.   Psychiatric:        Mood and Affect: Mood normal.        Behavior: Behavior normal.        Thought Content: Thought content normal.       Assessment and Plan :   1. Close Exposure to Covid-19 Virus    Likely viral in etiology. Counseled patient on nature of COVID-19 including modes of transmission, diagnostic testing, management and supportive care.  Offered symptomatic relief. COVID 19 testing is pending. Counseled patient on potential for adverse effects with medications prescribed/recommended today, ER and return-to-clinic precautions discussed, patient verbalized understanding.      Jaynee Eagles, PA-C 02/28/19 1310

## 2019-02-28 NOTE — Discharge Instructions (Addendum)

## 2019-02-28 NOTE — ED Triage Notes (Signed)
Patient presents to Urgent Care with complaints of requesting covid testing since being exposed last week. Patient reports he has no symptoms but wants to be sure.

## 2019-03-02 LAB — NOVEL CORONAVIRUS, NAA (HOSP ORDER, SEND-OUT TO REF LAB; TAT 18-24 HRS): SARS-CoV-2, NAA: NOT DETECTED

## 2019-03-03 ENCOUNTER — Encounter (HOSPITAL_COMMUNITY): Payer: Self-pay

## 2023-12-31 ENCOUNTER — Inpatient Hospital Stay: Attending: Hematology and Oncology

## 2023-12-31 ENCOUNTER — Inpatient Hospital Stay: Admitting: Hematology and Oncology

## 2023-12-31 VITALS — BP 116/74 | HR 77 | Temp 98.6°F | Resp 18 | Ht 64.0 in | Wt 215.3 lb

## 2023-12-31 DIAGNOSIS — F5089 Other specified eating disorder: Secondary | ICD-10-CM | POA: Diagnosis not present

## 2023-12-31 DIAGNOSIS — N920 Excessive and frequent menstruation with regular cycle: Secondary | ICD-10-CM | POA: Diagnosis not present

## 2023-12-31 DIAGNOSIS — Z79899 Other long term (current) drug therapy: Secondary | ICD-10-CM | POA: Insufficient documentation

## 2023-12-31 DIAGNOSIS — Z8249 Family history of ischemic heart disease and other diseases of the circulatory system: Secondary | ICD-10-CM

## 2023-12-31 DIAGNOSIS — D500 Iron deficiency anemia secondary to blood loss (chronic): Secondary | ICD-10-CM | POA: Diagnosis present

## 2023-12-31 DIAGNOSIS — D75839 Thrombocytosis, unspecified: Secondary | ICD-10-CM | POA: Insufficient documentation

## 2023-12-31 DIAGNOSIS — R5383 Other fatigue: Secondary | ICD-10-CM | POA: Diagnosis not present

## 2023-12-31 DIAGNOSIS — Z833 Family history of diabetes mellitus: Secondary | ICD-10-CM | POA: Diagnosis not present

## 2023-12-31 LAB — CBC WITH DIFFERENTIAL (CANCER CENTER ONLY)
Abs Immature Granulocytes: 0.01 10*3/uL (ref 0.00–0.07)
Basophils Absolute: 0.1 10*3/uL (ref 0.0–0.1)
Basophils Relative: 1 %
Eosinophils Absolute: 0.4 10*3/uL (ref 0.0–0.5)
Eosinophils Relative: 8 %
HCT: 29.2 % — ABNORMAL LOW (ref 36.0–46.0)
Hemoglobin: 8.4 g/dL — ABNORMAL LOW (ref 12.0–15.0)
Immature Granulocytes: 0 %
Lymphocytes Relative: 24 %
Lymphs Abs: 1.3 10*3/uL (ref 0.7–4.0)
MCH: 18.5 pg — ABNORMAL LOW (ref 26.0–34.0)
MCHC: 28.8 g/dL — ABNORMAL LOW (ref 30.0–36.0)
MCV: 64.3 fL — ABNORMAL LOW (ref 80.0–100.0)
Monocytes Absolute: 0.5 10*3/uL (ref 0.1–1.0)
Monocytes Relative: 10 %
Neutro Abs: 3 10*3/uL (ref 1.7–7.7)
Neutrophils Relative %: 57 %
Platelet Count: 300 10*3/uL (ref 150–400)
RBC: 4.54 MIL/uL (ref 3.87–5.11)
RDW: 21.3 % — ABNORMAL HIGH (ref 11.5–15.5)
Smear Review: NORMAL
WBC Count: 5.3 10*3/uL (ref 4.0–10.5)
nRBC: 0 % (ref 0.0–0.2)

## 2023-12-31 LAB — FERRITIN: Ferritin: 14 ng/mL (ref 11–307)

## 2023-12-31 LAB — IRON AND IRON BINDING CAPACITY (CC-WL,HP ONLY)
Iron: 33 ug/dL (ref 28–170)
Saturation Ratios: 6 % — ABNORMAL LOW (ref 10.4–31.8)
TIBC: 545 ug/dL — ABNORMAL HIGH (ref 250–450)
UIBC: 512 ug/dL — ABNORMAL HIGH (ref 148–442)

## 2023-12-31 NOTE — Assessment & Plan Note (Addendum)
 Lab review 04/29/2018: Hemoglobin 8.8, MCV 71.2 12/25/2023: Hemoglobin 8.3, MCV 61.9, platelets 634, RDW 17.9  Iron deficiency anemia: I counseled extensively regarding the different causes of iron deficiency including blood loss and malabsorption.   Recommendation: 1.  Check iron studies ferritin and CBC today 2. proceed with IV iron infusions 3.  Evaluation of bleeding  I discussed with the patient that potentially there may be a need for additional IV iron infusions if the iron levels were to remain low in the future. The frequency of need of IV iron would depend on many other factors including the rate of loss and by the degree of absorption.  Thrombocytosis is reactive in nature and therefore will improve spontaneously.  Return to clinic in 1 months with recheck on iron studies and hemoglobin.

## 2023-12-31 NOTE — Progress Notes (Signed)
 [facility] CONSULT NOTE  Patient Care Team: Patient, No Pcp Per as PCP - General (General Practice)  CHIEF COMPLAINTS/PURPOSE OF CONSULTATION: Microcytic iron deficiency anemia  HISTORY OF PRESENTING ILLNESS:  History of Present Illness Kayla [name] is a 43 year old female with anemia who presents for evaluation of her anemia and thrombocytosis. She was referred by Dr. Modi for evaluation of her anemia and thrombocytosis.  Anemia was first noted in 2019, initially described as borderline. She was prescribed iron supple meant but did not consistently take it. Recently, her menstrual cycles have been heavy, lasting up to seven days, sometimes with clots. Her last cycle was notably heavier.  She experiences cravings for ice chips and persistent fatigue, feeling tired most of the time. She recently took three doses of 65 mg iron pills, which improved her energy levels. She has not taken any iron today, awaiting further evaluation. She maintains a normal diet, including meat, and does not follow a vegetarian diet.  No hair loss is present, but she often feels cold.     I reviewed her records extensively and collaborated the history with the patient.   MEDICAL HISTORY:  Longstanding history of anemia  SURGICAL HISTORY: Past Surgical History:  Procedure Laterality Date   CESAREAN SECTION      SOCIAL HISTORY: Social History   Socioeconomic History   Marital status: Single    Spouse name: Not on file   Number of children: Not on file   Years of education: Not on file   Highest education level: Not on file  Occupational History   Not on file  Tobacco Use   Smoking status: Never   Smokeless tobacco: Never  Vaping Use   Vaping status: Never Used  Substance and Sexual Activity   Alcohol use: Yes    Comment: most days   Drug use: No   Sexual activity: Not on file  Other Topics Concern   Not on file  Social History Narrative   Not on file   Social  Drivers of Health   Financial Resource Strain: Not on file  Food Insecurity: Not on file  Transportation Needs: Not on file  Physical Activity: Not on file  Stress: Not on file  Social Connections: Not on file  Intimate Partner Violence: Not on file    FAMILY HISTORY: Family History  Problem Relation Age of Onset   Diabetes Mother    Hypertension Mother    Cancer Mother    Healthy Father     ALLERGIES:  has no known allergies.  MEDICATIONS:  Current Outpatient Medications  Medication Sig Dispense Refill   Aspirin-Salicylamide-Caffeine (BC HEADACHE POWDER PO) Take 1 packet by mouth as needed (headache).     No current facility-administered medications for this visit.    REVIEW OF SYSTEMS:   Constitutional: Denies fevers, chills or abnormal night sweats All other systems were reviewed with the patient and are negative.  PHYSICAL EXAMINATION: ECOG PERFORMANCE STATUS: 1 - Symptomatic but completely ambulatory  Vitals:   12/31/23 1013  BP: 116/74  Pulse: 77  Resp: 18  Temp: 98.6 F (37 C)  SpO2: 100%   Filed Weights   12/31/23 1013  Weight: 215 lb 4.8 oz (97.7 kg)    GENERAL:alert, no distress and comfortable  LABORATORY DATA:  I have reviewed the data as listed Lab Results  Component Value Date   WBC 4.5 04/29/2018   HGB 8.8 (L) 04/29/2018   HCT 30.2 (L) 04/29/2018  MCV 71.2 (L) 04/29/2018   PLT 253 04/29/2018   Lab Results  Component Value Date   NA 138 04/29/2018   K 3.3 (L) 04/29/2018   CL 104 04/29/2018   CO2 23 04/29/2018    ASSESSMENT AND PLAN:  Iron deficiency anemia due to chronic blood loss Lab review 04/29/2018: Hemoglobin 8.8, MCV 71.2 12/25/2023: Hemoglobin 8.3, MCV 61.9, platelets 634, RDW 17.9  Iron deficiency anemia: I counseled extensively regarding the different causes of iron deficiency including blood loss and malabsorption.   Recommendation: 1.  Check iron studies ferritin and CBC today 2. proceed with IV iron  infusions 3.  Evaluation of bleeding: Continue to follow with Dr. Kenney Peacemaker  I discussed with the patient that potentially there may be a need for additional IV iron infusions if the iron levels were to remain low in the future. The frequency of need of IV iron would depend on many other factors including the rate of loss and by the degree of absorption.  Thrombocytosis is reactive in nature and therefore will improve spontaneously. She plans to go to [place] in the first week of June. 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  Assessment and Plan Assessment & Plan Iron deficiency anemia Chronic iron deficiency anemia with fatigue, pica, and menorrhagia. Improved symptoms with oral iron suggest adequate absorption. Plan for iron infusion therapy to replenish stores and manage symptoms. Explained Venofer infusion therapy and monitoring post-infusion. - Order baseline iron studies. - Schedule Venofer infusions, three doses, one week apart, at [facility]. - Recheck labs in three months. - Coordinate infusion appointments before travel. - Discuss controlling menorrhagia to prevent further iron loss.     All questions were answered. The patient knows to call the clinic with any problems, questions or concerns.    Viinay K Rusty Villella, MD 12/31/23

## 2024-01-01 ENCOUNTER — Telehealth: Payer: Self-pay

## 2024-01-01 NOTE — Telephone Encounter (Signed)
 Dr. Gudena, patient will be scheduled as soon as possible.  Auth Submission: NO AUTH NEEDED Site of care: Site of care: [facility] Payer: [employer] with [employer] Medication & CPT/J Code(s) submitted: Venofer (Iron Sucrose) J1756 Route of submission (phone, fax, portal): phone Phone # [phone] Fax # Auth type: Buy/Bill PB Units/visits requested: 200mg  x 5 doses Reference number: [id number] Approval from: 01/01/24 to 05/03/24

## 2024-01-10 ENCOUNTER — Ambulatory Visit

## 2024-01-10 VITALS — BP 125/83 | HR 70 | Temp 98.4°F | Resp 16 | Ht 64.0 in | Wt 217.6 lb

## 2024-01-10 DIAGNOSIS — D500 Iron deficiency anemia secondary to blood loss (chronic): Secondary | ICD-10-CM | POA: Diagnosis not present

## 2024-01-10 DIAGNOSIS — N920 Excessive and frequent menstruation with regular cycle: Secondary | ICD-10-CM

## 2024-01-10 MED ORDER — IRON SUCROSE 20 MG/ML IV SOLN
200.0000 mg | Freq: Once | INTRAVENOUS | Status: AC
  Administered 2024-01-10: 200 mg via INTRAVENOUS
  Filled 2024-01-10: qty 10

## 2024-01-10 NOTE — Progress Notes (Signed)
 Diagnosis: Iron  Deficiency Anemia  Provider:  Praveen Mannam MD  Procedure: IV Push  IV Type: Peripheral, IV Location: R Antecubital  Venofer (Iron  Sucrose), Dose: 200 mg  Post Infusion IV Care: Observation period completed  Discharge: Condition: Good, Destination: Home . AVS Declined  Performed by:  Rachelle Bue, RN

## 2024-01-15 ENCOUNTER — Ambulatory Visit

## 2024-01-23 ENCOUNTER — Encounter: Payer: Self-pay | Admitting: Hematology and Oncology

## 2024-01-24 ENCOUNTER — Ambulatory Visit

## 2024-01-24 MED ORDER — IRON SUCROSE 20 MG/ML IV SOLN: 200.0000 mg | Freq: Once | INTRAVENOUS | Status: DC

## 2024-01-29 ENCOUNTER — Ambulatory Visit: Admitting: *Deleted

## 2024-01-29 VITALS — BP 122/83 | HR 76 | Temp 98.1°F | Resp 18 | Ht 64.0 in | Wt 212.8 lb

## 2024-01-29 DIAGNOSIS — D500 Iron deficiency anemia secondary to blood loss (chronic): Secondary | ICD-10-CM

## 2024-01-29 DIAGNOSIS — N920 Excessive and frequent menstruation with regular cycle: Secondary | ICD-10-CM | POA: Diagnosis not present

## 2024-01-29 MED ORDER — IRON SUCROSE 20 MG/ML IV SOLN
200.0000 mg | Freq: Once | INTRAVENOUS | Status: AC
  Administered 2024-01-29: 200 mg via INTRAVENOUS
  Filled 2024-01-29: qty 10

## 2024-01-29 NOTE — Progress Notes (Signed)
 Diagnosis: Acute Anemia  Provider:  Praveen Mannam MD  Procedure: IV Push  IV Type: Peripheral, IV Location: R Antecubital  Venofer  (Iron  Sucrose), Dose: 200 mg  Post Infusion IV Care: Observation period completed  Discharge: Condition: Good, Destination: Home . AVS Declined  Performed by:  Devonne Folk, RN

## 2024-01-31 ENCOUNTER — Ambulatory Visit

## 2024-01-31 VITALS — BP 136/90 | HR 72 | Temp 98.3°F | Resp 18 | Ht 64.25 in | Wt 217.2 lb

## 2024-01-31 DIAGNOSIS — D500 Iron deficiency anemia secondary to blood loss (chronic): Secondary | ICD-10-CM

## 2024-01-31 DIAGNOSIS — N920 Excessive and frequent menstruation with regular cycle: Secondary | ICD-10-CM

## 2024-01-31 MED ORDER — IRON SUCROSE 20 MG/ML IV SOLN
200.0000 mg | Freq: Once | INTRAVENOUS | Status: AC
  Administered 2024-01-31: 200 mg via INTRAVENOUS
  Filled 2024-01-31: qty 10

## 2024-01-31 NOTE — Progress Notes (Signed)
 Diagnosis: Iron Deficiency Anemia  Provider:  Chilton Greathouse MD  Procedure: IV Push  IV Type: Peripheral, IV Location: R Antecubital  Venofer (Iron Sucrose), Dose: 200 mg  Post Infusion IV Care: Observation period completed and Peripheral IV Discontinued  Discharge: Condition: Good, Destination: Home . AVS Declined  Performed by:  Adriana Mccallum, RN

## 2024-02-04 ENCOUNTER — Ambulatory Visit (INDEPENDENT_AMBULATORY_CARE_PROVIDER_SITE_OTHER)

## 2024-02-04 VITALS — BP 117/81 | HR 77 | Temp 98.2°F | Resp 18 | Ht 64.0 in | Wt 215.6 lb

## 2024-02-04 DIAGNOSIS — N920 Excessive and frequent menstruation with regular cycle: Secondary | ICD-10-CM

## 2024-02-04 DIAGNOSIS — D500 Iron deficiency anemia secondary to blood loss (chronic): Secondary | ICD-10-CM

## 2024-02-04 MED ORDER — IRON SUCROSE 20 MG/ML IV SOLN
200.0000 mg | Freq: Once | INTRAVENOUS | Status: AC
  Administered 2024-02-04: 200 mg via INTRAVENOUS
  Filled 2024-02-04: qty 10

## 2024-02-04 NOTE — Progress Notes (Signed)
 Diagnosis: Iron  Deficiency Anemia  Provider:  Praveen Mannam MD  Procedure: IV Push  IV Type: Peripheral, IV Location: R Antecubital  Venofer (Iron  Sucrose), Dose: 200 mg  Post Infusion IV Care: Observation period completed  Discharge: Condition: Good, Destination: Home . AVS Declined  Performed by:  Rachelle Bue, RN

## 2024-02-06 ENCOUNTER — Ambulatory Visit

## 2024-02-06 MED ORDER — IRON SUCROSE 20 MG/ML IV SOLN: 200.0000 mg | Freq: Once | INTRAVENOUS | Status: AC

## 2024-02-12 ENCOUNTER — Ambulatory Visit: Admitting: *Deleted

## 2024-02-12 VITALS — BP 123/78 | HR 83 | Temp 98.8°F | Resp 18 | Ht 64.0 in | Wt 216.0 lb

## 2024-02-12 DIAGNOSIS — D500 Iron deficiency anemia secondary to blood loss (chronic): Secondary | ICD-10-CM | POA: Diagnosis not present

## 2024-02-12 DIAGNOSIS — N920 Excessive and frequent menstruation with regular cycle: Secondary | ICD-10-CM

## 2024-02-12 MED ORDER — IRON SUCROSE 20 MG/ML IV SOLN
200.0000 mg | Freq: Once | INTRAVENOUS | Status: AC
  Administered 2024-02-12: 200 mg via INTRAVENOUS
  Filled 2024-02-12: qty 10

## 2024-02-12 NOTE — Progress Notes (Signed)
 Diagnosis: Acute Anemia  Provider:  Praveen Mannam MD  Procedure: IV Push  IV Type: Peripheral, IV Location: R Antecubital  Venofer  (Iron  Sucrose), Dose: 200 mg  Post Infusion IV Care: Observation period completed  Discharge: Condition: Good, Destination: Home . AVS Provided  Performed by:  Mathew Therisa NOVAK, RN
